# Patient Record
Sex: Male | Born: 1975 | Race: White | Hispanic: No | Marital: Married | Smoking: Never smoker
Health system: Southern US, Community
[De-identification: ages and names within clinical notes are randomized; demographics above are authoritative.]

## PROBLEM LIST (undated history)

## (undated) DIAGNOSIS — I10 Essential (primary) hypertension: Secondary | ICD-10-CM

---

## 2013-12-15 ENCOUNTER — Other Ambulatory Visit (HOSPITAL_COMMUNITY): Payer: Self-pay | Admitting: Family Medicine

## 2013-12-15 DIAGNOSIS — R1011 Right upper quadrant pain: Secondary | ICD-10-CM

## 2013-12-17 ENCOUNTER — Ambulatory Visit (HOSPITAL_COMMUNITY)
Admission: RE | Admit: 2013-12-17 | Discharge: 2013-12-17 | Disposition: A | Discharge status: Home / Self Care | Payer: 59 | Source: Ambulatory Visit | Attending: Family Medicine | Admitting: Family Medicine

## 2013-12-17 ENCOUNTER — Other Ambulatory Visit (HOSPITAL_COMMUNITY): Payer: Self-pay | Admitting: Family Medicine

## 2013-12-17 DIAGNOSIS — R1011 Right upper quadrant pain: Secondary | ICD-10-CM | POA: Insufficient documentation

## 2014-01-28 ENCOUNTER — Other Ambulatory Visit (HOSPITAL_COMMUNITY): Payer: Self-pay | Admitting: Family Medicine

## 2014-01-28 DIAGNOSIS — R1011 Right upper quadrant pain: Secondary | ICD-10-CM

## 2014-02-02 ENCOUNTER — Other Ambulatory Visit (HOSPITAL_COMMUNITY): Payer: 59

## 2014-02-03 ENCOUNTER — Encounter (HOSPITAL_COMMUNITY): Payer: Self-pay

## 2014-02-03 ENCOUNTER — Encounter (HOSPITAL_COMMUNITY)
Admission: RE | Admit: 2014-02-03 | Discharge: 2014-02-03 | Disposition: A | Discharge status: Home / Self Care | Payer: 59 | Source: Ambulatory Visit | Attending: Family Medicine | Admitting: Family Medicine

## 2014-02-03 DIAGNOSIS — R1011 Right upper quadrant pain: Secondary | ICD-10-CM

## 2014-02-03 HISTORY — DX: Essential (primary) hypertension: I10

## 2014-02-03 MED ORDER — STERILE WATER FOR INJECTION IJ SOLN
INTRAMUSCULAR | Status: AC
  Administered 2014-02-03: 2.19 mL via INTRAVENOUS
  Filled 2014-02-03: qty 10

## 2014-02-03 MED ORDER — TECHNETIUM TC 99M MEBROFENIN IV KIT
5.0000 | PACK | Freq: Once | INTRAVENOUS | Status: AC | PRN
  Administered 2014-02-03: 5 via INTRAVENOUS

## 2014-02-03 MED ORDER — SINCALIDE 5 MCG IJ SOLR
INTRAMUSCULAR | Status: AC
  Administered 2014-02-03: 2.19 ug via INTRAVENOUS
  Filled 2014-02-03: qty 5

## 2014-04-23 IMAGING — NM NM HEPATO W/GB/PHARM/[PERSON_NAME]
2 series · 12 of 12 positions shown · non-contrast
Comparison: None.

CLINICAL DATA: Upper abdominal pain

EXAM:
NUCLEAR MEDICINE HEPATOBILIARY IMAGING WITH GALLBLADDER EF
Views: Anterior right upper quadrant
Radionuclide:  Technetium 99m mebrofenin
Dose:  5.0 mCi
Route of administration:  Intravenous

[hida · 3.20mm/px · 6 of 30 frames shown (1 of 2)]
[frame 3/30]
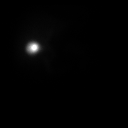
[frame 8/30]
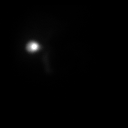
[frame 13/30]
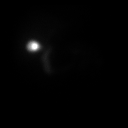
[frame 18/30]
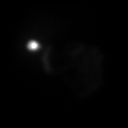
[frame 23/30]
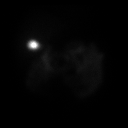
[frame 28/30]
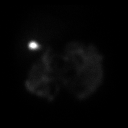

[hida · 3.20mm/px · 6 of 60 frames shown (2 of 2)]
[frame 6/60]
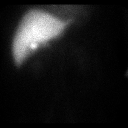
[frame 16/60]
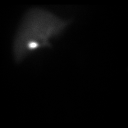
[frame 26/60]
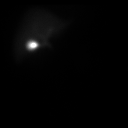
[frame 36/60]
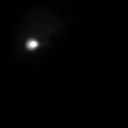
[frame 46/60]
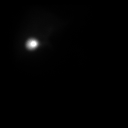
[frame 56/60]
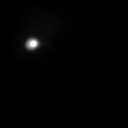

[12 of 12 positions shown; findings below may reference images not displayed]

FINDINGS: Liver uptake of radiotracer is normal. There is prompt visualization
of gallbladder and small bowel, indicating patency of the cystic and
common bile ducts.

A weight based dose, 2.2 mcg, of CCK was administered intravenously
with calculation of the computer generated ejection fraction of
radiotracer from the gallbladder. The patient did not have symptoms
with CCK administration. The computer generated ejection fraction of
radiotracer from the gallbladder is normal at 70.7%, normal greater
than 38%.
IMPRESSION: Study within normal limits.

## 2014-09-16 ENCOUNTER — Encounter: Payer: Self-pay | Admitting: Neurology

## 2014-09-16 ENCOUNTER — Encounter (INDEPENDENT_AMBULATORY_CARE_PROVIDER_SITE_OTHER): Payer: Self-pay

## 2014-09-16 ENCOUNTER — Ambulatory Visit (INDEPENDENT_AMBULATORY_CARE_PROVIDER_SITE_OTHER): Payer: 59 | Admitting: Neurology

## 2014-09-16 VITALS — BP 126/80 | HR 75 | Temp 98.1°F | Ht 71.0 in | Wt 240.0 lb

## 2014-09-16 DIAGNOSIS — R0683 Snoring: Secondary | ICD-10-CM

## 2014-09-16 DIAGNOSIS — G2581 Restless legs syndrome: Secondary | ICD-10-CM

## 2014-09-16 DIAGNOSIS — G4726 Circadian rhythm sleep disorder, shift work type: Secondary | ICD-10-CM

## 2014-09-16 DIAGNOSIS — G478 Other sleep disorders: Secondary | ICD-10-CM

## 2014-09-16 DIAGNOSIS — E669 Obesity, unspecified: Secondary | ICD-10-CM

## 2014-09-16 NOTE — Patient Instructions (Signed)

## 2014-09-16 NOTE — Progress Notes (Signed)
Subjective:    Patient ID: Alex Gay is a 38 y.o. male.  HPI    [name], MD, PhD [facility] [address] [address] [place], [place] [address]  Dear Dr. Hilma Favors,  I saw your patient, Alex Gay, upon your kind request in my neurologic clinic today for initial consultation of his sleep disorder, in particular, concern for underlying obstructive sleep apnea. The patient is unaccompanied today. As you know, Alex Gay is a 38 year old right-handed gentleman with an underlying medical history of hypertension, who reports snoring, daytime somnolence, and shift work. 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 He has some restless leg symptoms when he tries to fall asleep but is not sure if he twitches in his sleep. He and his wife do not overlap very much with their sleep schedules. He works from midnight to 8:30 AM.  He usually gets home by 9.he tries to eat a light meal and goes to bed around 10 or 10:30 and typically may be asleep by 11 AM and sleeps till 4 PM. He has a dog in his bed. He tries to sleep on his sides typically. His dog does not bother him in his bed. He does not have to get up to use the bathroom. He does not typically wake up with a headache. His snoring over the course of the past few years has become worse per wife. She is not reporting any apneas but again they don't necessarily overlap very much with her sleep schedules. He does not typically wake up with a gasping sensation. He does not wake up rested. His Epworth sleepiness score is 7. He does nap almost every day prior to going in to work. He may sleep from 9 PM to 10:30 PM. Taking a nap helps. He does not drink very much caffeine. He is a nonsmoker. He drinks alcohol occasionally. He is an only child. There is no family history of obstructive sleep apnea. Mother slept little  He recalls. She died with brain cancer. His father is 46 years old and has been  diagnosed with prostate cancer which has been treated.   He denies cataplexy, sleep paralysis, hypnagogic or hypnopompic hallucinations, or sleep attacks. He does not report any vivid dreams, nightmares, dream enactments, or parasomnias, such as sleep talking or sleep walking. The patient has not had a sleep study or a home sleep test.   His Past Medical History Is Significant For: Past Medical History  Diagnosis Date  . Hypertension     His Past Surgical History Is Significant For: No past surgical history on file.  His Family History Is Significant For: Family History  Problem Relation Age of Onset  . Cancer - Prostate Father     His Social History Is Significant For: History   Social History  . Marital Status: Married    Spouse Name: Alex Gay    Number of Children: 0  . Years of Education: 13   Occupational History  .  [employer]   Social History Main Topics  . Smoking status: Never Smoker   . Smokeless tobacco: Never Used  . Alcohol Use: Yes     Comment: two beers weekly  . Drug Use: No  . Sexual Activity: None   Other Topics Concern  . None   Social History Narrative   Consumes no caffeine, right handed    His Allergies Are:  Not on File:  His Current Medications Are:  Outpatient Encounter Prescriptions as of 09/16/2014  Medication Sig  . lisinopril (PRINIVIL,ZESTRIL) 20 MG tablet Take by mouth 2 (two) times daily.  :  Review of Systems:  Out of a complete 14 point review of systems, all are reviewed and negative with the exception of these symptoms as listed below:   Review of Systems  Constitutional: Positive for fatigue.  Neurological:       Snoring    Objective:  Neurologic Exam  Physical Exam Physical Examination:   Filed Vitals:   09/16/14 0938  BP: 126/80  Pulse: 75  Temp: 98.1 F (36.7 C)    General Examination: The patient is a very pleasant 38 y.o. male in no acute distress. He appears well-developed and well-nourished  and well groomed.   HEENT: Normocephalic, atraumatic, pupils are equal, round and reactive to light and accommodation. Funduscopic exam is normal with sharp disc margins noted. Extraocular tracking is good without limitation to gaze excursion or nystagmus noted. Normal smooth pursuit is noted. Hearing is grossly intact. Tympanic membranes are clear bilaterally. Face is symmetric with normal facial animation and normal facial sensation. Speech is clear with no dysarthria noted. There is no hypophonia. There is no lip, neck/head, jaw or voice tremor. Neck is supple with full range of passive and active motion. There are no carotid bruits on auscultation. Oropharynx exam reveals: mild mouth dryness, good dental hygiene and mild to moderate  airway crowding, due to  tonsils in place, mildly redundant soft palate, and wider and slightly elongated tongue . Mallampati is class II. Tongue protrudes centrally and palate elevates symmetrically. Tonsils are 1+ in size. Neck size is 16 5/8 inches. He has a Mild overbite. Nasal inspection reveals no significant nasal mucosal bogginess or redness and no septal deviation.   Chest: Clear to auscultation without wheezing, rhonchi or crackles noted.  Heart: S1+S2+0, regular and normal without murmurs, rubs or gallops noted.   Abdomen: Soft, non-tender and non-distended with normal bowel sounds appreciated on auscultation.  Extremities: There is no pitting edema in the distal lower extremities bilaterally. Pedal pulses are intact.  Skin: Warm and dry without trophic changes noted. There are no varicose veins.  Musculoskeletal: exam reveals no obvious joint deformities, tenderness or joint swelling or erythema.   Neurologically:  Mental status: The patient is awake, alert and oriented in all 4 spheres. His immediate and remote memory, attention, language skills and fund of knowledge are appropriate. There is no evidence of aphasia, agnosia, apraxia or anomia. Speech is  clear with normal prosody and enunciation. Thought process is linear. Mood is normal and affect is normal.  Cranial nerves II - XII are as described above under HEENT exam. In addition: shoulder shrug is normal with equal shoulder height noted. Motor exam: Normal bulk, strength and tone is noted. There is no drift, tremor or rebound. Romberg is negative. Reflexes are 2+ throughout. Babinski: Toes are flexor bilaterally. Fine motor skills and coordination: intact with normal finger taps, normal hand movements, normal rapid alternating patting, normal foot taps and normal foot agility.  Cerebellar testing: No dysmetria or intention tremor on finger to nose testing. Heel to shin is unremarkable bilaterally. There is no truncal or gait ataxia.  Sensory exam: intact to light touch, pinprick, vibration, temperature sense in the upper and lower extremities.  Gait, station and balance: He stands easily. No veering to one side is noted. No leaning to one side is noted. Posture is age-appropriate and stance is  narrow based. Gait shows normal stride length and normal pace. No problems turning are noted. He turns en bloc. Tandem walk is unremarkable.              Assessment and Plan:   In summary, Alex Gay is a very pleasant 39 y.o.-year old male with a history of hypertension and obesity, who his history and physical exam concerning for underlying obstructive sleep apnea. In addition his shift work causes him to have a disrupted sleep awake schedule. He also endorses mild restless leg symptoms before falling asleep.  I had a long chat with the patient about my findings and the diagnosis of OSA, its prognosis and treatment options. We talked about medical treatments, surgical interventions and non-pharmacological approaches. I explained in particular the risks and ramifications of untreated moderate to severe OSA, especially with respect to developing cardiovascular disease down the Road, including congestive  heart failure, difficult to treat hypertension, cardiac arrhythmias, or stroke. Even type 2 diabetes has, in part, been linked to untreated OSA. Symptoms of untreated OSA include daytime sleepiness, memory problems, mood irritability and mood disorder such as depression and anxiety, lack of energy, as well as recurrent headaches, especially morning headaches. We talked about trying to maintain a healthy lifestyle in general, as well as the importance of weight control. I encouraged the patient to eat healthy, exercise daily and keep well hydrated, to keep a scheduled bedtime and wake time routine, to not skip any meals and eat healthy snacks in between meals. I advised the patient not to drive when feeling sleepy. I recommended the following at this time: sleep study with potential positive airway pressure titration. (We will score hypopneas at 4% and split the sleep study into diagnostic and treatment portion, if the estimated. 2 hour AHI is >20/h).   I explained the sleep test procedure to the patient and also outlined possible surgical and non-surgical treatment options of OSA, including the use of a custom-made dental device (which would require a referral to a specialist dentist or oral surgeon), upper airway surgical options, such as pillar implants, radiofrequency surgery, tongue base surgery, and UPPP (which would involve a referral to an ENT surgeon). Rarely, jaw surgery such as mandibular advancement may be considered.  I also explained the CPAP treatment option to the patient, who indicated that he would be willing to try CPAP if the need arises. I explained the importance of being compliant with PAP treatment, not only for insurance purposes but primarily to improve His symptoms, and for the patient's long term health benefit, including to reduce His cardiovascular risks. I answered all his questions today and the patient was in agreement. I would like to see him back after the sleep study is  completed and encouraged him to call with any interim questions, concerns, problems or updates.   Thank you very much for allowing me to participate in the care of this nice patient. If I can be of any further assistance to you please do not hesitate to call me at [phone].  Sincerely,   [name], MD, PhD

## 2014-10-09 ENCOUNTER — Ambulatory Visit (INDEPENDENT_AMBULATORY_CARE_PROVIDER_SITE_OTHER): Payer: 59 | Admitting: Neurology

## 2014-10-09 VITALS — BP 132/78

## 2014-10-09 DIAGNOSIS — G4733 Obstructive sleep apnea (adult) (pediatric): Secondary | ICD-10-CM

## 2014-10-09 DIAGNOSIS — G2581 Restless legs syndrome: Secondary | ICD-10-CM

## 2014-10-09 DIAGNOSIS — R0683 Snoring: Secondary | ICD-10-CM

## 2014-10-09 DIAGNOSIS — G478 Other sleep disorders: Secondary | ICD-10-CM

## 2014-10-09 DIAGNOSIS — G4726 Circadian rhythm sleep disorder, shift work type: Secondary | ICD-10-CM

## 2014-10-09 DIAGNOSIS — E669 Obesity, unspecified: Secondary | ICD-10-CM

## 2014-10-09 NOTE — Sleep Study (Signed)
Please see the scanned sleep study interpretation located in the Procedure tab within the Chart Review section. 

## 2014-10-21 ENCOUNTER — Telehealth: Payer: Self-pay | Admitting: Neurology

## 2014-10-21 DIAGNOSIS — G4733 Obstructive sleep apnea (adult) (pediatric): Secondary | ICD-10-CM

## 2014-10-21 NOTE — Telephone Encounter (Signed)
Please call and notify the patient that the recent sleep study did confirm the diagnosis of obstructive sleep apnea. OSA is overall mild, but worth treating to see if he feels better after treatment. To that end I recommend treatment for this in the form of autoPAP. I have placed an order in the chart. Please send referral, talk to patient, send report to PCP and referring MD, also a copy to patient and we will need a FU in sleep clinic for 6-8 weeks post-PAP set up. Thanks,   [name], MD, PhD [facility])

## 2014-10-26 ENCOUNTER — Encounter: Payer: Self-pay | Admitting: Neurology

## 2014-10-27 ENCOUNTER — Encounter: Payer: Self-pay | Admitting: *Deleted

## 2014-10-27 NOTE — Telephone Encounter (Signed)
Patient was contacted and provided the results of his sleep study.  Patient was informed that Dr. Rexene Alberts had recommended a trial of Auto CPAP and her was referred to [facility] for CPAP set up.  The patient gave verbal permission to mail a copy of her test results.  Dr. Sharilyn Sites was faxed a copy of the report.

## 2015-02-02 ENCOUNTER — Encounter: Payer: Self-pay | Admitting: Neurology

## 2015-12-07 ENCOUNTER — Telehealth: Payer: Self-pay

## 2015-12-07 NOTE — Telephone Encounter (Signed)
I left patient a message letting him know that we have received ppw from [facility] to renew his supplies. He will need to make f/u appt, we last saw him in 2015. I left our call back number to make f/u appt.

## 2018-01-31 DIAGNOSIS — J029 Acute pharyngitis, unspecified: Secondary | ICD-10-CM | POA: Diagnosis not present

## 2018-01-31 DIAGNOSIS — R509 Fever, unspecified: Secondary | ICD-10-CM | POA: Diagnosis not present

## 2018-01-31 DIAGNOSIS — J018 Other acute sinusitis: Secondary | ICD-10-CM | POA: Diagnosis not present

## 2018-05-27 DIAGNOSIS — R7309 Other abnormal glucose: Secondary | ICD-10-CM | POA: Diagnosis not present

## 2018-05-27 DIAGNOSIS — E6609 Other obesity due to excess calories: Secondary | ICD-10-CM | POA: Diagnosis not present

## 2018-05-27 DIAGNOSIS — Z1389 Encounter for screening for other disorder: Secondary | ICD-10-CM | POA: Diagnosis not present

## 2018-05-27 DIAGNOSIS — Z6830 Body mass index (BMI) 30.0-30.9, adult: Secondary | ICD-10-CM | POA: Diagnosis not present

## 2018-05-27 DIAGNOSIS — Z0000 Encounter for general adult medical examination without abnormal findings: Secondary | ICD-10-CM | POA: Diagnosis not present

## 2018-06-11 DIAGNOSIS — D225 Melanocytic nevi of trunk: Secondary | ICD-10-CM | POA: Diagnosis not present

## 2018-06-11 DIAGNOSIS — Z1283 Encounter for screening for malignant neoplasm of skin: Secondary | ICD-10-CM | POA: Diagnosis not present

## 2018-06-11 DIAGNOSIS — L918 Other hypertrophic disorders of the skin: Secondary | ICD-10-CM | POA: Diagnosis not present

## 2018-08-20 DIAGNOSIS — M722 Plantar fascial fibromatosis: Secondary | ICD-10-CM | POA: Diagnosis not present

## 2018-08-20 DIAGNOSIS — M76822 Posterior tibial tendinitis, left leg: Secondary | ICD-10-CM | POA: Diagnosis not present

## 2018-08-20 DIAGNOSIS — M7731 Calcaneal spur, right foot: Secondary | ICD-10-CM | POA: Diagnosis not present

## 2018-08-20 DIAGNOSIS — M76821 Posterior tibial tendinitis, right leg: Secondary | ICD-10-CM | POA: Diagnosis not present

## 2018-08-27 DIAGNOSIS — M722 Plantar fascial fibromatosis: Secondary | ICD-10-CM | POA: Diagnosis not present

## 2018-09-03 DIAGNOSIS — M71571 Other bursitis, not elsewhere classified, right ankle and foot: Secondary | ICD-10-CM | POA: Diagnosis not present

## 2018-09-03 DIAGNOSIS — M722 Plantar fascial fibromatosis: Secondary | ICD-10-CM | POA: Diagnosis not present

## 2018-09-26 DIAGNOSIS — R21 Rash and other nonspecific skin eruption: Secondary | ICD-10-CM | POA: Diagnosis not present

## 2018-09-26 DIAGNOSIS — H5789 Other specified disorders of eye and adnexa: Secondary | ICD-10-CM | POA: Diagnosis not present

## 2018-09-26 DIAGNOSIS — L239 Allergic contact dermatitis, unspecified cause: Secondary | ICD-10-CM | POA: Diagnosis not present

## 2018-10-16 DIAGNOSIS — H1031 Unspecified acute conjunctivitis, right eye: Secondary | ICD-10-CM | POA: Diagnosis not present

## 2018-11-18 DIAGNOSIS — J209 Acute bronchitis, unspecified: Secondary | ICD-10-CM | POA: Diagnosis not present

## 2018-11-18 DIAGNOSIS — R067 Sneezing: Secondary | ICD-10-CM | POA: Diagnosis not present

## 2018-11-18 DIAGNOSIS — R05 Cough: Secondary | ICD-10-CM | POA: Diagnosis not present

## 2019-01-01 DIAGNOSIS — J209 Acute bronchitis, unspecified: Secondary | ICD-10-CM | POA: Diagnosis not present

## 2019-01-01 DIAGNOSIS — R0989 Other specified symptoms and signs involving the circulatory and respiratory systems: Secondary | ICD-10-CM | POA: Diagnosis not present

## 2019-01-09 DIAGNOSIS — R05 Cough: Secondary | ICD-10-CM | POA: Diagnosis not present

## 2019-01-09 DIAGNOSIS — R112 Nausea with vomiting, unspecified: Secondary | ICD-10-CM | POA: Diagnosis not present

## 2019-01-09 DIAGNOSIS — J22 Unspecified acute lower respiratory infection: Secondary | ICD-10-CM | POA: Diagnosis not present

## 2019-01-09 DIAGNOSIS — B349 Viral infection, unspecified: Secondary | ICD-10-CM | POA: Diagnosis not present

## 2019-01-09 DIAGNOSIS — Z1389 Encounter for screening for other disorder: Secondary | ICD-10-CM | POA: Diagnosis not present

## 2019-02-26 DIAGNOSIS — M722 Plantar fascial fibromatosis: Secondary | ICD-10-CM | POA: Diagnosis not present

## 2019-03-12 DIAGNOSIS — M722 Plantar fascial fibromatosis: Secondary | ICD-10-CM | POA: Diagnosis not present

## 2019-03-13 DIAGNOSIS — S92001A Unspecified fracture of right calcaneus, initial encounter for closed fracture: Secondary | ICD-10-CM | POA: Diagnosis not present

## 2019-03-19 DIAGNOSIS — S92001D Unspecified fracture of right calcaneus, subsequent encounter for fracture with routine healing: Secondary | ICD-10-CM | POA: Diagnosis not present

## 2019-04-02 DIAGNOSIS — S92001D Unspecified fracture of right calcaneus, subsequent encounter for fracture with routine healing: Secondary | ICD-10-CM | POA: Diagnosis not present

## 2019-04-18 DIAGNOSIS — S92001D Unspecified fracture of right calcaneus, subsequent encounter for fracture with routine healing: Secondary | ICD-10-CM | POA: Diagnosis not present

## 2019-10-15 ENCOUNTER — Other Ambulatory Visit: Payer: Self-pay

## 2019-10-15 DIAGNOSIS — Z20822 Contact with and (suspected) exposure to covid-19: Secondary | ICD-10-CM

## 2019-10-17 LAB — NOVEL CORONAVIRUS, NAA: SARS-CoV-2, NAA: DETECTED — AB

## 2021-08-22 ENCOUNTER — Encounter: Payer: Self-pay | Admitting: Orthopedic Surgery

## 2021-08-22 ENCOUNTER — Ambulatory Visit (INDEPENDENT_AMBULATORY_CARE_PROVIDER_SITE_OTHER): Payer: 59 | Admitting: Orthopedic Surgery

## 2021-08-22 ENCOUNTER — Other Ambulatory Visit: Payer: Self-pay

## 2021-08-22 ENCOUNTER — Ambulatory Visit: Payer: Self-pay

## 2021-08-22 VITALS — Ht 72.0 in | Wt 222.0 lb

## 2021-08-22 DIAGNOSIS — M722 Plantar fascial fibromatosis: Secondary | ICD-10-CM

## 2021-08-22 DIAGNOSIS — M6701 Short Achilles tendon (acquired), right ankle: Secondary | ICD-10-CM

## 2021-08-22 DIAGNOSIS — M79671 Pain in right foot: Secondary | ICD-10-CM

## 2021-09-19 ENCOUNTER — Encounter: Payer: Self-pay | Admitting: Orthopedic Surgery

## 2021-09-19 ENCOUNTER — Ambulatory Visit: Payer: 59 | Admitting: Orthopedic Surgery

## 2021-09-19 NOTE — Progress Notes (Signed)
   Office Visit Note   Patient: Alex Gay           Date of Birth: April 29, 1976           MRN: [id number] Visit Date: 08/22/2021              Requested by: Sharilyn Sites, [place],   [id number] PCP: Sharilyn Sites, MD  Chief Complaint  Patient presents with   Right Foot - Pain      HPI: Patient is a 45-the origin of the plantar fascia.  Patient states the pain is more with walking with or without the boot he has not taking any medicines.  Again heel painyear-old gentleman who presents for evaluation of his right foot.  He has recently had a comprehensive physical.  Patient has been followed by podiatry.  Patient denies any recent trauma but states he has been having some recurrent heel pain over the plantar fascia.  Assessment & Plan: Visit Diagnoses:  1. Pain in right foot     Plan: Recommended Achilles stretching this was demonstrated recommended Voltaren gel 3 times a day reevaluate in 4 weeks.  Follow-Up Instructions: Return in about 4 weeks (around 09/19/2021).   Ortho Exam  Patient is alert, oriented, no adenopathy, well-dressed, normal affect, normal respiratory effort. Examination patient's radiographs do so some old avulsion injuries but no acute trauma.  There is calcification of the arteries.  Lateral compression of the calcaneus is nontender to palpation he has good dorsalis pedis pulse good hair growth.  He does have Achilles tightness with dorsiflexion only to neutral patient's symptoms are reproduced by palpation of the origin of the plantar fascia consistent with Planter fasciitis.  The subtalar joint is nontender to palpation the calcaneocuboid joint is nontender to palpation the fibula is not tender to palpation.  Imaging: No results found. No images are attached to the encounter.  Labs: No results found for: HGBA1C, ESRSEDRATE, CRP, LABURIC, REPTSTATUS, GRAMSTAIN, CULT, LABORGA   No results found for: ALBUMIN, PREALBUMIN,  CBC  No results found for: MG No results found for: VD25OH  No results found for: PREALBUMIN No flowsheet data found.   Body mass index is 30.11 kg/m.  Orders:  Orders Placed This Encounter  Procedures   XR Foot Complete Right   XR Os Calcis Right   No orders of the defined types were placed in this encounter.    Procedures: No procedures performed  Clinical Data: No additional findings.  ROS:  All other systems negative, except as noted in the HPI. Review of Systems  Objective: Vital Signs: Ht 6' (1.829 m)   Wt 222 lb (100.7 kg)   BMI 30.11 kg/m   Specialty Comments:  No specialty comments available.  PMFS History: There are no problems to display for this patient.  Past Medical History:  Diagnosis Date   Hypertension     Family History  Problem Relation Age of Onset   Cancer - Prostate Father     History reviewed. No pertinent surgical history. Social History   Occupational History    Employer: [employer]  Tobacco Use   Smoking status: Never   Smokeless tobacco: Never  Substance and Sexual Activity   Alcohol use: Yes    Comment: two beers weekly   Drug use: No   Sexual activity: Not on file

## 2023-11-28 HISTORY — PX: SHOULDER ARTHROSCOPY: SHX128

## 2024-03-24 ENCOUNTER — Other Ambulatory Visit: Payer: Self-pay | Admitting: Urology

## 2024-03-28 NOTE — Progress Notes (Signed)
 Left voicemail stating arrival time 0600 at [facility] main entrance, NPO after MN Sunday, bring driver's license and insurance card and no money or valuables, do not wear sandals, flip flops or crocs and no metal from waist down, If taking aspirin or aspirin products, stop aspirin, NSAIDS and pepto bismol on Tuesday and no vitamins or supplements after Wednesday morning. Informed him we will try to call back later today or Monday.

## 2024-03-31 ENCOUNTER — Encounter (HOSPITAL_COMMUNITY): Payer: Self-pay | Admitting: Urology

## 2024-03-31 NOTE — Progress Notes (Signed)
 Spoke w/ via phone for pre-op interview---patient Lab needs dos----KUB         Lab results------ COVID test ---ntot indicated--patient states asymptomatic no test needed Arrive at ------0600- NPO after MN  Pre-Surgery Ensure or G2:  Med rec completed yes Medications to take morning of surgery --Lisinopril Diabetic medication -----  GLP1 agonist last dose: GLP1 instructions:  Patient instructed no nail polish to be worn day of surgery Patient instructed to bring photo id and insurance card day of surgery Patient aware to have Driver (ride ) / caregiver    for 24 hours after surgery - spouse- Alex Gay [phone] Patient Special Instructions ----- Pre-Op special Instructions -----per Litho/[facility]  Patient verbalized understanding of instructions that were given at this phone interview. Patient denies chest pain, sob, fever, cough at the interview.

## 2024-04-04 ENCOUNTER — Ambulatory Visit (HOSPITAL_COMMUNITY)
Admission: RE | Admit: 2024-04-04 | Discharge: 2024-04-04 | Disposition: A | Discharge status: Home / Self Care | Attending: Urology | Admitting: Urology

## 2024-04-04 ENCOUNTER — Ambulatory Visit (HOSPITAL_COMMUNITY)

## 2024-04-04 ENCOUNTER — Encounter (HOSPITAL_COMMUNITY): Payer: Self-pay | Admitting: Urology

## 2024-04-04 ENCOUNTER — Other Ambulatory Visit: Payer: Self-pay

## 2024-04-04 ENCOUNTER — Encounter (HOSPITAL_COMMUNITY)
Admission: RE | Disposition: A | Discharge status: Home / Self Care | Payer: Self-pay | Source: Home / Self Care | Attending: Urology

## 2024-04-04 DIAGNOSIS — Z841 Family history of disorders of kidney and ureter: Secondary | ICD-10-CM | POA: Insufficient documentation

## 2024-04-04 DIAGNOSIS — N201 Calculus of ureter: Secondary | ICD-10-CM | POA: Diagnosis present

## 2024-04-04 DIAGNOSIS — I10 Essential (primary) hypertension: Secondary | ICD-10-CM | POA: Diagnosis not present

## 2024-04-04 DIAGNOSIS — Z79899 Other long term (current) drug therapy: Secondary | ICD-10-CM | POA: Insufficient documentation

## 2024-04-04 HISTORY — PX: EXTRACORPOREAL SHOCK WAVE LITHOTRIPSY: SHX1557

## 2024-04-04 SURGERY — LITHOTRIPSY, ESWL
Anesthesia: LOCAL | Laterality: Right

## 2024-04-04 MED ORDER — DIPHENHYDRAMINE HCL 25 MG PO CAPS
25.0000 mg | ORAL_CAPSULE | ORAL | Status: AC
  Administered 2024-04-04: 25 mg via ORAL
  Filled 2024-04-04: qty 1

## 2024-04-04 MED ORDER — SODIUM CHLORIDE 0.9 % IV SOLN
INTRAVENOUS | Status: DC
  Administered 2024-04-04: 1000 mL via INTRAVENOUS

## 2024-04-04 MED ORDER — DIAZEPAM 5 MG PO TABS
10.0000 mg | ORAL_TABLET | ORAL | Status: AC
  Administered 2024-04-04: 10 mg via ORAL
  Filled 2024-04-04: qty 2

## 2024-04-04 MED ORDER — CIPROFLOXACIN HCL 500 MG PO TABS
500.0000 mg | ORAL_TABLET | ORAL | Status: AC
  Administered 2024-04-04: 500 mg via ORAL
  Filled 2024-04-04: qty 1

## 2024-04-04 NOTE — Discharge Instructions (Addendum)
 See [facility] discharge instructions in chart.

## 2024-04-04 NOTE — Progress Notes (Signed)
 Red blotchy area right lower abd from litho.

## 2024-04-04 NOTE — Op Note (Signed)
 See [facility] OP note scanned into chart. Also because of the size, density, location and other factors that cannot be anticipated I feel this will likely be a staged procedure. This fact supersedes any indication in the scanned [facility] operative note to the contrary.

## 2024-04-04 NOTE — H&P (Signed)
 CC/HPI: 03/06/2024: Seen today for evaluation of possible kidney stone. He endorses a family history of such but never has had a stone event himself. About a week ago he developed right lower back and flank pain with associated urinary urgency and weak stream/little urine output. That eventually eased off over the course of several hours. Approximately 2 days ago he had similar symptom recurrence but this time associated with some pain and discomfort with voiding. He has not had any gross hematuria, denies interval stone material passage. He has not had a recurrence of symptoms since then. Currently voiding at his baseline with grossly stable nonbothersome symptomology. No correlating fever/chills or nausea/vomiting. UA with increased microscopic hematuria today.   03/18/2024: Patient returns today for ongoing surveillance of the previously identified distal right ureteral calculus. Denies interval stone material passage. He has been grossly asymptomatic except for 1 isolated incident of gross hematuria with right-sided pain and discomfort that resolved over the course of about 12 to 18 hours. Currently denying any pain/discomfort, new or worsening LUTS including absence of any dysuria or gross hematuria. He continues tamsulosin and has been straining his urine. No interval fever/chills, nausea/vomiting.     ALLERGIES: No Known Drug Allergies    MEDICATIONS: Lisinopril 10 MG Tablet  Tamsulosin HCl 0.4 MG Capsule 1 capsule PO Daily  Amoxicillin-Pot Clavulanate 875-125 MG Tablet  HYDROcodone-Acetaminophen 5-325 MG Tablet 1 tablet PO Q 6 H PRN     GU PSH: No GU PSH    NON-GU PSH: No Non-GU PSH    GU PMH: Flank Pain - 03/06/2024 Microscopic hematuria - 03/06/2024 Ureteral calculus - 03/06/2024 Encounter for Prostate Cancer screening - 07/13/2023, - 05/10/2022 Hydrocele - 07/13/2023, - 06/14/2023, - 02/15/2023, - 01/15/2023, - 06/12/2022, - 05/10/2022 Left testicular pain - 02/15/2023, - 01/15/2023    NON-GU  PMH: Hypertension    FAMILY HISTORY: Brain tumor - Mother Kidney Stones - Father Prostate Cancer - Father   SOCIAL HISTORY: Marital Status: Divorced Ethnicity: Not Hispanic Or Latino; Race: White Current Smoking Status: Patient has never smoked.   Tobacco Use Assessment Completed: Used Tobacco in last 30 days? Does drink.  Drinks 3 caffeinated drinks per day.    REVIEW OF SYSTEMS:    GU Review Male:   Patient denies frequent urination, hard to postpone urination, burning/ pain with urination, get up at night to urinate, leakage of urine, stream starts and stops, trouble starting your stream, have to strain to urinate , erection problems, and penile pain.  Gastrointestinal (Upper):   Patient denies nausea, vomiting, and indigestion/ heartburn.  Gastrointestinal (Lower):   Patient denies diarrhea and constipation.  Constitutional:   Patient denies fever, night sweats, weight loss, and fatigue.  Skin:   Patient denies skin rash/ lesion and itching.  Eyes:   Patient denies blurred vision and double vision.  Ears/ Nose/ Throat:   Patient denies sore throat and sinus problems.  Hematologic/Lymphatic:   Patient denies swollen glands and easy bruising.  Cardiovascular:   Patient denies leg swelling and chest pains.  Respiratory:   Patient denies cough and shortness of breath.  Endocrine:   Patient denies excessive thirst.  Musculoskeletal:   Patient denies back pain and joint pain.  Neurological:   Patient denies headaches and dizziness.  Psychologic:   Patient denies depression and anxiety.   VITAL SIGNS:      03/18/2024 08:27 AM  Weight 238 lb / 107.95 kg  Height 72 in / 182.88 cm  BP 152/100 mmHg  Pulse 94 /  min  Temperature 97.7 F / 36.5 C  BMI 32.3 kg/m   MULTI-SYSTEM PHYSICAL EXAMINATION:    Constitutional: Well-nourished. No physical deformities. Normally developed. Good grooming.  Neck: Neck symmetrical, not swollen. Normal tracheal position.  Respiratory: No labored  breathing, no use of accessory muscles.   Cardiovascular: Normal temperature, normal extremity pulses, no swelling, no varicosities.  Skin: No paleness, no jaundice, no cyanosis. No lesion, no ulcer, no rash.  Neurologic / Psychiatric: Oriented to time, oriented to place, oriented to person. No depression, no anxiety, no agitation.  Gastrointestinal: No mass, no tenderness, no rigidity, non obese abdomen. No CVA or flank tenderness  Musculoskeletal: Normal gait and station of head and neck.     Complexity of Data:  Source Of History:  Patient, Medical Record Summary  Records Review:   Previous Doctor Records, Previous Hospital Records, Previous Patient Records  Urine Test Review:   Urinalysis, Urine Culture  X-Ray Review: KUB: Reviewed Films. Discussed With Patient.  C.T. Abdomen/Pelvis: Reviewed Films. Reviewed Report.     05/10/22  PSA  Total PSA 0.90 ng/mL    03/18/24  Urinalysis  Urine Appearance Clear   Urine Color Yellow   Urine Glucose Neg mg/dL  Urine Bilirubin Neg mg/dL  Urine Ketones Neg mg/dL  Urine Specific Gravity 1.025   Urine Blood 2+ ery/uL  Urine pH 5.5   Urine Protein Trace mg/dL  Urine Urobilinogen 0.2 mg/dL  Urine Nitrites Neg   Urine Leukocyte Esterase Neg leu/uL  Urine WBC/hpf NS (Not Seen)   Urine RBC/hpf 3 - 10/hpf   Urine Epithelial Cells 0 - 5/hpf   Urine Bacteria NS (Not Seen)   Urine Mucous Present   Urine Yeast NS (Not Seen)   Urine Trichomonas Not Present   Urine Cystals Amorph Urates   Urine Casts NS (Not Seen)   Urine Sperm Not Present    PROCEDURES:         KUB - 60454  A single view of the abdomen is obtained. Previously defined distal right ureteral calculi remains easily seen on today's KUB study. An arrow was drawn for easier identification. Calculus measures approximately 5.5 x 7 mm.      Patient confirmed No Neulasta OnPro Device.           Urinalysis w/Scope Dipstick Dipstick Cont'd Micro  Color: Yellow Bilirubin: Neg  mg/dL WBC/hpf: NS (Not Seen)  Appearance: Clear Ketones: Neg mg/dL RBC/hpf: 3 - 09/WJX  Specific Gravity: 1.025 Blood: 2+ ery/uL Bacteria: NS (Not Seen)  pH: 5.5 Protein: Trace mg/dL Cystals: Amorph Urates  Glucose: Neg mg/dL Urobilinogen: 0.2 mg/dL Casts: NS (Not Seen)    Nitrites: Neg Trichomonas: Not Present    Leukocyte Esterase: Neg leu/uL Mucous: Present      Epithelial Cells: 0 - 5/hpf      Yeast: NS (Not Seen)      Sperm: Not Present    ASSESSMENT:      ICD-10 Details  1 GU:   Ureteral calculus - N20.1 Right, Acute, Complicated Injury   PLAN:           Orders Labs Urine Culture          Schedule Return Visit/Planned Activity: Next Available Appointment - Follow up MD, Schedule Surgery          Document Letter(s):  Created for Patient: Clinical Summary         Notes:   KUB demonstrates continued presence of a distal right ureteral calculus. No obvious progression with medical  expulsive therapy from time of last exam. He has only had 1 episode of pain/discomfort since last seen, asymptomatic today. Discussed management moving forward, patient eager to proceed with a definitive intervention.   For shockwave lithotripsy I described the risks which include arrhythmia, kidney contusion, kidney hemorrhage, need for transfusion, long-term risk of diabetes or hypertension, back discomfort, flank ecchymosis, flank abrasion, inability to break up stone, inability to pass stone fragments, Steinstrasse, infection associated with obstructing stones, need for different surgical procedure and possible need for repeat shockwave lithotripsy.   For ureteroscopy I described the risks which include heart attack, stroke, pulmonary embolus, death, bleeding, infection, damage to contiguous structures, positioning injury, ureteral stricture, ureteral avulsion, ureteral injury, need for ureteral stent, inability to perform ureteroscopy, need for an interval procedure, inability to clear stone burden,  stent discomfort and pain.   All questions answered, patient would like to proceed with shockwave lithotripsy. Scheduling sheet will be submitted today and then patient will be contacted for next available ESWL. In the interval he will continue tamsulosin, straining his urine and staying well-hydrated/nourished. He has pain medication to use if needed. In the event he passes the stone before procedure date he will contact the office. With acutely worsening and poorly controlled symptomology and understands appropriate follow-up/ED follow-up instructions provided as well. Precautionary urine culture sent today.

## 2024-04-07 ENCOUNTER — Encounter (HOSPITAL_COMMUNITY): Payer: Self-pay | Admitting: Urology

## 2024-05-12 ENCOUNTER — Other Ambulatory Visit: Payer: Self-pay | Admitting: Urology

## 2024-05-13 ENCOUNTER — Other Ambulatory Visit: Payer: Self-pay | Admitting: Urology

## 2024-05-14 ENCOUNTER — Encounter (HOSPITAL_COMMUNITY): Payer: Self-pay | Admitting: Urology

## 2024-05-14 NOTE — Progress Notes (Signed)
 Spoke w/ via phone for pre-op interview---Pt Lab needs dos----KUB         Lab results------ COVID test --Not indicated---patient states asymptomatic no test needed Arrive at -------0645 NPO after MN Pre-Surgery Ensure or G2:  Med rec completed Medications to take morning of surgery -----Lisinopril, norco, if needed Diabetic medication -----  GLP1 agonist last dose: GLP1 instructions:  Patient instructed no nail polish to be worn day of surgery Patient instructed to bring photo id and insurance card day of surgery Patient aware to have Driver (ride ) / caregiver    for 24 hours after surgery - spouse- Alex Gay Patient Special Instructions -----Stop Ibuprofen Sat by 0800, NO aspirin after Friday am, bring blue folder, driver's license and ins card, take laxative Sunday, hydrate well and eat light meal that evening. Pre-Op special Instructions -----Alex Gay and litho  Patient verbalized understanding of instructions that were given at this phone interview. Patient denies chest pain, sob, fever, cough at the interview.

## 2024-05-16 NOTE — H&P (Signed)
 Office Visit Report     05/09/2024   --------------------------------------------------------------------------------   Alex Gay  MRN: [id number]  DOB: 01-20-1976, 48 year old Male  SSN:    PRIMARY CARE:  Minus Amel, MD  PRIMARY CARE FAX:  [phone]  REFERRING:  Celinda Collar, NP  PROVIDER:  Cleophus Dade. Jarvis Mesa, M.D.  TREATING:  Williemae Harsh, NP  LOCATION:  [facility] [phone]     --------------------------------------------------------------------------------   CC/HPI: 03/06/2024: Seen today for evaluation of possible kidney stone. He endorses a family history of such but never has had a stone event himself. About a week ago he developed right lower back and flank pain with associated urinary urgency and weak stream/little urine output. That eventually eased off over the course of several hours. Approximately 2 days ago he had similar symptom recurrence but this time associated with some pain and discomfort with voiding. He has not had any gross hematuria, denies interval stone material passage. He has not had a recurrence of symptoms since then. Currently voiding at his baseline with grossly stable nonbothersome symptomology. No correlating fever/chills or nausea/vomiting. UA with increased microscopic hematuria today.   03/18/2024: Patient returns today for ongoing surveillance of the previously identified distal right ureteral calculus. Denies interval stone material passage. He has been grossly asymptomatic except for 1 isolated incident of gross hematuria with right-sided pain and discomfort that resolved over the course of about 12 to 18 hours. Currently denying any pain/discomfort, new or worsening LUTS including absence of any dysuria or gross hematuria. He continues tamsulosin and has been straining his urine. No interval fever/chills, nausea/vomiting.   04/24/2024: Returns for follow-up KUB after undergoing shockwave lithotripsy to treat a  previous identified distal right ureteral calculus. Procedure was performed on 5/9.   He initially passed a couple small stone fragments with blood products in the first 24 to 48 hours post procedure. He was asymptomatic until about a week ago when he developed some mild right sided pain and discomfort with associated increased obstructive and irritative voiding symptoms. The symptoms eventually resolved and he is asymptomatic today. UA without concerning microscopic abnormalities. KUB shows a partially treated distal right ureteral calculi when compared to prior imaging. Denies any interval fevers or chills, nausea/vomiting. He was previously taking tamsulosin but discontinued it.   05/09/2024: Here for follow-up evaluation and repeat KUB in regards to ongoing surveillance of her previously identified and treated with shockwave lithotripsy distal right ureteral calculus. Denies any interval stone material passage. He has had a couple of instances of mild to moderate right lower quadrant pain in the interval. This usually abates on its own but earlier this week he did have to take hydrocodone. He remains on tamsulosin. No new or worsening LUTS including absence of any dysuria or gross hematuria.     ALLERGIES: No Known Drug Allergies    MEDICATIONS: Lisinopril 10 MG Tablet  Tamsulosin HCl 0.4 MG Capsule 1 capsule PO Daily  Amoxicillin-Pot Clavulanate 875-125 MG Tablet     GU PSH: No GU PSH    NON-GU PSH: No Non-GU PSH    GU PMH: Ureteral calculus - 04/24/2024, - 03/18/2024, - 03/06/2024 Flank Pain - 03/06/2024 Microscopic hematuria - 03/06/2024 Encounter for Prostate Cancer screening - 07/13/2023, - 05/10/2022 Hydrocele - 07/13/2023, - 06/14/2023, - 02/15/2023, - 01/15/2023, - 06/12/2022, - 05/10/2022 Left testicular pain - 02/15/2023, - 01/15/2023    NON-GU PMH: Hypertension    FAMILY HISTORY: Brain tumor - Mother Kidney Stones -  Father Prostate Cancer - Father   SOCIAL HISTORY: Marital Status:  Divorced Ethnicity: Not Hispanic Or Latino; Race: White Current Smoking Status: Patient has never smoked.   Tobacco Use Assessment Completed: Used Tobacco in last 30 days? Does drink.  Drinks 3 caffeinated drinks per day.    REVIEW OF SYSTEMS:    GU Review Male:   Patient denies frequent urination, hard to postpone urination, burning/ pain with urination, get up at night to urinate, leakage of urine, stream starts and stops, trouble starting your stream, have to strain to urinate , erection problems, and penile pain.  Gastrointestinal (Upper):   Patient denies nausea, vomiting, and indigestion/ heartburn.  Gastrointestinal (Lower):   Patient denies diarrhea and constipation.  Constitutional:   Patient denies fever, night sweats, weight loss, and fatigue.  Skin:   Patient denies skin rash/ lesion and itching.  Eyes:   Patient denies blurred vision and double vision.  Ears/ Nose/ Throat:   Patient denies sore throat and sinus problems.  Hematologic/Lymphatic:   Patient denies swollen glands and easy bruising.  Cardiovascular:   Patient denies leg swelling and chest pains.  Respiratory:   Patient denies cough and shortness of breath.  Endocrine:   Patient denies excessive thirst.  Musculoskeletal:   Patient denies back pain and joint pain.  Neurological:   Patient denies headaches and dizziness.  Psychologic:   Patient denies depression and anxiety.   VITAL SIGNS:      05/09/2024 08:22 AM  BP 151/95 mmHg  Pulse 64 /min  Temperature 97.7 F / 36.5 C   MULTI-SYSTEM PHYSICAL EXAMINATION:    Constitutional: Well-nourished. No physical deformities. Normally developed. Good grooming.  Neck: Neck symmetrical, not swollen. Normal tracheal position.  Respiratory: No labored breathing, no use of accessory muscles.   Skin: No paleness, no jaundice, no cyanosis. No lesion, no ulcer, no rash.  Neurologic / Psychiatric: Oriented to time, oriented to place, oriented to person. No depression, no  anxiety, no agitation.  Musculoskeletal: Normal gait and station of head and neck.     Complexity of Data:  Source Of History:  Patient, Medical Record Summary  Records Review:   Previous Doctor Records, Previous Hospital Records, Previous Patient Records  Urine Test Review:   Urinalysis, Urine Culture  X-Ray Review: KUB: Reviewed Films. Discussed With Patient.  C.T. Abdomen/Pelvis: Reviewed Films. Reviewed Report.     05/10/22  PSA  Total PSA 0.90 ng/mL    PROCEDURES:         KUB - 40981  A single view of the abdomen is obtained. Distal right ureteral stone remains in same anatomical location as before but does appear to have changed shape slightly reflecting partial treatment from prior shockwave lithotripsy.      Patient confirmed No Neulasta OnPro Device.           Urinalysis w/Scope Dipstick Dipstick Cont'd Micro  Color: Yellow Bilirubin: Neg mg/dL WBC/hpf: NS (Not Seen)  Appearance: Clear Ketones: Neg mg/dL RBC/hpf: 0 - 2/hpf  Specific Gravity: 1.015 Blood: 1+ ery/uL Bacteria: Rare (0-9/hpf)  pH: 6.0 Protein: Neg mg/dL Cystals: NS (Not Seen)  Glucose: Neg mg/dL Urobilinogen: 0.2 mg/dL Casts: NS (Not Seen)    Nitrites: Neg Trichomonas: Not Present    Leukocyte Esterase: Neg leu/uL Mucous: Present      Epithelial Cells: NS (Not Seen)      Yeast: NS (Not Seen)      Sperm: Not Present    ASSESSMENT:      ICD-10 Details  1 GU:   Ureteral calculus - N20.1    PLAN:            Medications New Meds: HYDROcodone-Acetaminophen 5-325 MG Tablet 1 tablet PO Q 6 H PRN   #15  0 Refill(s)  Pharmacy Name:  [facility] [phone]  Address:  [address]   [place], [place] [id number]  Phone:  [phone]  Fax:  [phone]            Orders Labs Urine Culture          Schedule Return Visit/Planned Activity: Next Available Appointment - Schedule Surgery          Document Letter(s):  Created for Patient: Clinical Summary         Notes:   Discussed  management moving forward including repeat shockwave lithotripsy versus definitive ureteroscopy. He would prefer repeat lithotripsy but also understands if that is unsuccessful we would then have to proceed with ureteroscopy. He will continue tamsulosin and monitor for any interval stone material passage or new/worsening symptomology. He will contact the office to update me on his progress or schedule a follow-up evaluation with myself or one of the other providers in clinic for management. I will go ahead and submit scheduling paperwork today and he will be contacted to schedule next available procedure.   I did refill his Vicodin per his request. He is worried about getting caught out with severe pain prior to/post repeat treatment. He also understands the role of anti-inflammatories for mild to moderate symptomology.    * Signed by Williemae Harsh, NP on 05/09/24 at 8:40 AM (EDT)*

## 2024-05-19 ENCOUNTER — Ambulatory Visit (HOSPITAL_COMMUNITY)
Admission: RE | Admit: 2024-05-19 | Discharge: 2024-05-19 | Disposition: A | Discharge status: Home / Self Care | Attending: Urology | Admitting: Urology

## 2024-05-19 ENCOUNTER — Other Ambulatory Visit: Payer: Self-pay

## 2024-05-19 ENCOUNTER — Encounter (HOSPITAL_COMMUNITY)
Admission: RE | Disposition: A | Discharge status: Home / Self Care | Payer: Self-pay | Source: Home / Self Care | Attending: Urology

## 2024-05-19 ENCOUNTER — Encounter (HOSPITAL_COMMUNITY): Payer: Self-pay | Admitting: Urology

## 2024-05-19 ENCOUNTER — Ambulatory Visit (HOSPITAL_COMMUNITY)

## 2024-05-19 DIAGNOSIS — N201 Calculus of ureter: Secondary | ICD-10-CM | POA: Insufficient documentation

## 2024-05-19 HISTORY — PX: EXTRACORPOREAL SHOCK WAVE LITHOTRIPSY: SHX1557

## 2024-05-19 SURGERY — LITHOTRIPSY, ESWL
Anesthesia: LOCAL | Laterality: Right

## 2024-05-19 MED ORDER — CIPROFLOXACIN HCL 500 MG PO TABS
500.0000 mg | ORAL_TABLET | ORAL | Status: AC
  Administered 2024-05-19: 500 mg via ORAL
  Filled 2024-05-19: qty 1

## 2024-05-19 MED ORDER — SODIUM CHLORIDE 0.9 % IV SOLN: INTRAVENOUS | Status: DC

## 2024-05-19 MED ORDER — DIPHENHYDRAMINE HCL 25 MG PO CAPS
25.0000 mg | ORAL_CAPSULE | ORAL | Status: AC
  Administered 2024-05-19: 25 mg via ORAL
  Filled 2024-05-19: qty 1

## 2024-05-19 MED ORDER — DIAZEPAM 5 MG PO TABS
10.0000 mg | ORAL_TABLET | ORAL | Status: AC
  Administered 2024-05-19: 10 mg via ORAL
  Filled 2024-05-19: qty 2

## 2024-05-19 NOTE — Discharge Instructions (Signed)
1. You should strain your urine and collect all fragments and bring them to your follow up appointment.  °2. You should take your pain medication as needed.  Please call if your pain is severe to the point that it is not controlled with your pain medication. °3. You should call if you develop fever > 101 or persistent nausea or vomiting. °4. Your doctor may prescribe tamsulosin to take to help facilitate stone passage. °

## 2024-05-19 NOTE — Interval H&P Note (Signed)
 History and Physical Interval Note:  05/19/2024 7:36 AM  Alex Gay  has presented today for surgery, with the diagnosis of RIGHT URETERAL STONE.  The various methods of treatment have been discussed with the patient and family. After consideration of risks, benefits and other options for treatment, the patient has consented to  Procedure(s): LITHOTRIPSY, ESWL (Right) as a surgical intervention.  The patient's history has been reviewed, patient examined, no change in status, stable for surgery.  I have reviewed the patient's chart and labs.  Questions were answered to the patient's satisfaction.     [employer]

## 2024-05-19 NOTE — Op Note (Signed)
 See [employer] operative note scanned into chart. Also because of the size, density, location and other factors that cannot be anticipated I feel this will likely be a staged procedure. This fact supersedes any indication in the scanned [place] stone operative note to the contrary.

## 2024-05-20 ENCOUNTER — Encounter (HOSPITAL_COMMUNITY): Payer: Self-pay | Admitting: Urology
